# Patient Record
Sex: Male | Born: 1970 | Race: White | Hispanic: No | Marital: Married | Smoking: Former smoker
Health system: Southern US, Community
[De-identification: ages and names within clinical notes are randomized; demographics above are authoritative.]

## PROBLEM LIST (undated history)

## (undated) DIAGNOSIS — S42309A Unspecified fracture of shaft of humerus, unspecified arm, initial encounter for closed fracture: Secondary | ICD-10-CM

## (undated) HISTORY — PX: TONSILLECTOMY: SUR1361

---

## 2016-01-26 ENCOUNTER — Ambulatory Visit
Admission: EM | Admit: 2016-01-26 | Discharge: 2016-01-26 | Disposition: A | Discharge status: Home / Self Care | Payer: Managed Care, Other (non HMO) | Attending: Family Medicine | Admitting: Family Medicine

## 2016-01-26 ENCOUNTER — Ambulatory Visit (INDEPENDENT_AMBULATORY_CARE_PROVIDER_SITE_OTHER): Payer: Managed Care, Other (non HMO)

## 2016-01-26 ENCOUNTER — Encounter: Payer: Self-pay | Admitting: Emergency Medicine

## 2016-01-26 DIAGNOSIS — S61411A Laceration without foreign body of right hand, initial encounter: Secondary | ICD-10-CM

## 2016-01-26 HISTORY — DX: Unspecified fracture of shaft of humerus, unspecified arm, initial encounter for closed fracture: S42.309A

## 2016-01-26 MED ORDER — MUPIROCIN 2 % EX OINT: 1.0000 "application " | TOPICAL_OINTMENT | Freq: Three times a day (TID) | CUTANEOUS | Status: DC

## 2016-01-26 MED ORDER — SULFAMETHOXAZOLE-TRIMETHOPRIM 800-160 MG PO TABS: 1.0000 | ORAL_TABLET | Freq: Two times a day (BID) | ORAL | Status: AC

## 2016-01-26 MED ORDER — LIDOCAINE HCL (PF) 1 % IJ SOLN
10.0000 mL | Freq: Once | INTRAMUSCULAR | Status: AC
  Administered 2016-01-26: 10 mL via INTRADERMAL

## 2016-01-26 NOTE — ED Notes (Signed)
Pt reports cut Right hand on metal roofing. 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 Pt had about an 8 foot fall, landed on Left leg.  Pt denies hitting head or LOC.

## 2016-01-26 NOTE — ED Notes (Signed)
Confirmed in Epic , pt received TDAP 08-26-2015.

## 2016-01-26 NOTE — ED Notes (Signed)
Bill put ace wrap around pt's R hand.

## 2016-01-26 NOTE — Discharge Instructions (Signed)
Laceration Care, Adult °A laceration is a cut that goes through all of the layers of the skin and into the tissue that is right under the skin. Some lacerations heal on their own. Others need to be closed with stitches (sutures), staples, skin adhesive strips, or skin glue. Proper laceration care minimizes the risk of infection and helps the laceration to heal better. °HOW TO CARE FOR YOUR LACERATION °If sutures or staples were used: °· Keep the wound clean and dry. °· If you were given a bandage (dressing), you should change it at least one time per day or as told by your health care provider. You should also change it if it becomes wet or dirty. °· Keep the wound completely dry for the first 24 hours or as told by your health care provider. After that time, you may shower or bathe. However, make sure that the wound is not soaked in water until after the sutures or staples have been removed. °· Clean the wound one time each day or as told by your health care provider: °¨ Wash the wound with soap and water. °¨ Rinse the wound with water to remove all soap. °¨ Pat the wound dry with a clean towel. Do not rub the wound. °· After cleaning the wound, apply a thin layer of antibiotic ointment as told by your health care provider. This will help to prevent infection and keep the dressing from sticking to the wound. °· Have the sutures or staples removed as told by your health care provider. °If skin adhesive strips were used: °· Keep the wound clean and dry. °· If you were given a bandage (dressing), you should change it at least one time per day or as told by your health care provider. You should also change it if it becomes dirty or wet. °· Do not get the skin adhesive strips wet. You may shower or bathe, but be careful to keep the wound dry. °· If the wound gets wet, pat it dry with a clean towel. Do not rub the wound. °· Skin adhesive strips fall off on their own. You may trim the strips as the wound heals. Do not  remove skin adhesive strips that are still stuck to the wound. They will fall off in time. °If skin glue was used: °· Try to keep the wound dry, but you may briefly wet it in the shower or bath. Do not soak the wound in water, such as by swimming. °· After you have showered or bathed, gently pat the wound dry with a clean towel. Do not rub the wound. °· Do not do any activities that will make you sweat heavily until the skin glue has fallen off on its own. °· Do not apply liquid, cream, or ointment medicine to the wound while the skin glue is in place. Using those may loosen the film before the wound has healed. °· If you were given a bandage (dressing), you should change it at least one time per day or as told by your health care provider. You should also change it if it becomes dirty or wet. °· If a dressing is placed over the wound, be careful not to apply tape directly over the skin glue. Doing that may cause the glue to be pulled off before the wound has healed. °· Do not pick at the glue. The skin glue usually remains in place for 5-10 days, then it falls off of the skin. °General Instructions °· Take over-the-counter and prescription   medicines only as told by your health care provider. °· If you were prescribed an antibiotic medicine or ointment, take or apply it as told by your doctor. Do not stop using it even if your condition improves. °· To help prevent scarring, make sure to cover your wound with sunscreen whenever you are outside after stitches are removed, after adhesive strips are removed, or when glue remains in place and the wound is healed. Make sure to wear a sunscreen of at least 30 SPF. °· Do not scratch or pick at the wound. °· Keep all follow-up visits as told by your health care provider. This is important. °· Check your wound every day for signs of infection. Watch for: °· Redness, swelling, or pain. °· Fluid, blood, or pus. °· Raise (elevate) the injured area above the level of your heart  while you are sitting or lying down, if possible. °SEEK MEDICAL CARE IF: °· You received a tetanus shot and you have swelling, severe pain, redness, or bleeding at the injection site. °· You have a fever. °· A wound that was closed breaks open. °· You notice a bad smell coming from your wound or your dressing. °· You notice something coming out of the wound, such as wood or glass. °· Your pain is not controlled with medicine. °· You have increased redness, swelling, or pain at the site of your wound. °· You have fluid, blood, or pus coming from your wound. °· You notice a change in the color of your skin near your wound. °· You need to change the dressing frequently due to fluid, blood, or pus draining from the wound. °· You develop a new rash. °· You develop numbness around the wound. °SEEK IMMEDIATE MEDICAL CARE IF: °· You develop severe swelling around the wound. °· Your pain suddenly increases and is severe. °· You develop painful lumps near the wound or on skin that is anywhere on your body. °· You have a red streak going away from your wound. °· The wound is on your hand or foot and you cannot properly move a finger or toe. °· The wound is on your hand or foot and you notice that your fingers or toes look pale or bluish. °  °This information is not intended to replace advice given to you by your health care provider. Make sure you discuss any questions you have with your health care provider. °  °Document Released: 09/23/2005 Document Revised: 02/07/2015 Document Reviewed: 09/19/2014 °Elsevier Interactive Patient Education ©2016 Else[employer] ° °Stitches, Staples, or Adhesive Wound Closure °Health care providers use stitches (sutures), staples, and certain glue (skin adhesives) to hold skin together while it heals (wound closure). You may need this treatment after you have surgery or if you cut your skin accidentally. These methods help your skin to heal more quickly and make it less likely that you will have  a scar. A wound may take several months to heal completely. °The type of wound you have determines when your wound gets closed. In most cases, the wound is closed as soon as possible (primary skin closure). Sometimes, closure is delayed so the wound can be cleaned and allowed to heal naturally. This reduces the chance of infection. Delayed closure may be needed if your wound: °· Is caused by a bite. °· Happened more than 6 hours ago. °· Involves loss of skin or the tissues under the skin. °· Has dirt or debris in it that cannot be removed. °· Is infected. °WHAT   ARE THE DIFFERENT KINDS OF WOUND CLOSURES? °There are many options for wound closure. The one that your health care provider uses depends on how deep and how large your wound is. °Adhesive Glue °To use this type of glue to close a wound, your health care provider holds the edges of the wound together and paints the glue on the surface of your skin. You may need more than one layer of glue. Then the wound may be covered with a light bandage (dressing). °This type of skin closure may be used for small wounds that are not deep (superficial). Using glue for wound closure is less painful than other methods. It does not require a medicine that numbs the area (local anesthetic). This method also leaves nothing to be removed. Adhesive glue is often used for children and on facial wounds. °Adhesive glue cannot be used for wounds that are deep, uneven, or bleeding. It is not used inside of a wound.  °Adhesive Strips °These strips are made of sticky (adhesive), porous paper. They are applied across your skin edges like a regular adhesive bandage. You leave them on until they fall off. °Adhesive strips may be used to close very superficial wounds. They may also be used along with sutures to improve the closure of your skin edges.  °Sutures °Sutures are the oldest method of wound closure. Sutures can be made from natural substances, such as silk, or from synthetic  materials, such as nylon and steel. They can be made from a material that your body can break down as your wound heals (absorbable), or they can be made from a material that needs to be removed from your skin (nonabsorbable). They come in many different strengths and sizes. °Your health care provider attaches the sutures to a steel needle on one end. Sutures can be passed through your skin, or through the tissues beneath your skin. Then they are tied and cut. Your skin edges may be closed in one continuous stitch or in separate stitches. °Sutures are strong and can be used for all kinds of wounds. Absorbable sutures may be used to close tissues under the skin. The disadvantage of sutures is that they may cause skin reactions that lead to infection. Nonabsorbable sutures need to be removed. °Staples °When surgical staples are used to close a wound, the edges of your skin on both sides of the wound are brought close together. A staple is placed across the wound, and an instrument secures the edges together. Staples are often used to close surgical cuts (incisions). °Staples are faster to use than sutures, and they cause less skin reaction. Staples need to be removed using a tool that bends the staples away from your skin. °HOW DO I CARE FOR MY WOUND CLOSURE? °· Take medicines only as directed by your health care provider. °· If you were prescribed an antibiotic medicine for your wound, finish it all even if you start to feel better. °· Use ointments or creams only as directed by your health care provider. °· Wash your hands with soap and water before and after touching your wound. °· Do not soak your wound in water. Do not take baths, swim, or use a hot tub until your health care provider approves. °· Ask your health care provider when you can start showering. Cover your wound if directed by your health care provider. °· Do not take out your own sutures or staples. °· Do not pick at your wound. Picking can cause an  infection. °·   Keep all follow-up visits as directed by your health care provider. This is important. HOW LONG WILL I HAVE MY WOUND CLOSURE?  Leave adhesive glue on your skin until the glue peels away.  Leave adhesive strips on your skin until the strips fall off.  Absorbable sutures will dissolve within several days.  Nonabsorbable sutures and staples must be removed. The location of the wound will determine how long they stay in. This can range from several days to a couple of weeks. WHEN SHOULD I SEEK HELP FOR MY WOUND CLOSURE? Contact your health care provider if:  You have a fever.  You have chills.  You have drainage, redness, swelling, or pain at your wound.  There is a bad smell coming from your wound.  The skin edges of your wound start to separate after your sutures have been removed.  Your wound becomes thick, raised, and darker in color after your sutures come out (scarring).   This information is not intended to replace advice given to you by your health care provider. Make sure you discuss any questions you have with your health care provider.   Document Released: 06/18/2001 Document Revised: 10/14/2014 Document Reviewed: 03/02/2014 Elsevier Interactive Patient Education 2016 Else[employer] Inc.  Wound Check If you have a wound, it may take some time to heal. Eventually, a scar will form. The scar will also fade with time. It is important to take care of your wound while it is healing. This helps to protect your wound from infection.  HOW SHOULD I TAKE CARE OF MY WOUND AT HOME?  Some wounds are allowed to close on their own or are repaired at a later date. There are many different ways to close and cover a wound, including stitches (sutures), skin glue, and adhesive strips. Follow your health care provider's instructions about:  Wound care.  Bandage (dressing) changes and removal.  Wound closure removal.  Take medicines only as directed by your health care  provider.  Keep all follow-up visits as directed by your health care provider. This is important.  Do not take baths, swim, or use a hot tub until your health care provider approves. You may shower as directed by your health care provider.  Keep your wound clean and dry. WHAT AFFECTS SCAR FORMATION? Scars affect each person differently. How your body scars depends on:  The location and size of your wound.  Traits that you inherited from your parents (genetic predisposition).  How you take care of your wound. Irritation and inflammation increase the amount of scar formation.  Sun exposure. This can darken a scar. WHEN SHOULD I CALL OR SEE MY HEALTH CARE PROVIDER? Call or see your health care provider if:  You have redness, swelling, or pain at your wound site.  You have fluid, blood, or pus coming from your wound.  You have muscle aches, chills, or a general ill feeling.  You notice a bad smell coming from the wound.  Your wound separates after the sutures, staples, or skin adhesive strips have been removed.  You have persistent nausea or vomiting.  You have a fever.  You are dizzy. WHEN SHOULD I CALL 911 OR GO TO THE EMERGENCY ROOM? Call 911 or go to the emergency room if:  You faint.  You have difficulty breathing.   This information is not intended to replace advice given to you by your health care provider. Make sure you discuss any questions you have with your health care provider.   Document Released:  06/29/2004 Document Revised: 10/14/2014 Document Reviewed: 07/05/2014 Elsevier Interactive Patient Education [employer].

## 2016-01-26 NOTE — ED Notes (Signed)
Pt reports he had a tetanus shot last year when he cut his Left arm. [facility]

## 2016-01-26 NOTE — ED Provider Notes (Signed)
CSN: [id number]     Arrival date & time 01/26/16  1824 History   First MD Initiated Contact with Patient 01/26/16 1915     Chief Complaint  Patient presents with  . 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 He fell to the ground his hand hit his scattered leaves and debris states categorically that there was no chickened feces or droppings. He is current on his DTaP which she received at [facility] about 7 months ago. He is allergic to azithromycin   Past Medical History  Diagnosis Date  . Arm fracture     as a child   Past Surgical History  Procedure Laterality Date  . Tonsillectomy     History reviewed. No pertinent family history. Social History  Substance Use Topics  . Smoking status: Former Games developer  . Smokeless tobacco: None  . Alcohol Use: Yes    Review of Systems  Constitutional: Positive for activity change. Negative for fever, chills and fatigue.  Skin: Positive for wound.  All other systems reviewed and are negative.   Allergies  Azithromycin  Home Medications   Prior to Admission medications   Medication Sig Start Date End Date Taking? Authorizing Provider  mupirocin ointment (BACTROBAN) 2 % Apply 1 application topically 3 (three) times daily. 01/26/16   Lutricia Feil, PA-C  sulfamethoxazole-trimethoprim (BACTRIM DS,SEPTRA DS) 800-160 MG tablet Take 1 tablet by mouth 2 (two) times daily. 01/26/16   Lutricia Feil, PA-C   Meds Ordered and Administered this Visit   Medications  lidocaine (PF) (XYLOCAINE) 1 % injection 10 mL (10 mLs Intradermal Given 01/26/16 1930)    BP 140/96 mmHg  Pulse 106  Temp(Src) 97.6 F (36.4 C) (Oral)  Resp 18  Ht 6' (1.829 m)  Wt 240 lb (108.863 kg)  BMI 32.54 kg/m2 No data found.   Physical Exam  Constitutional: He is oriented to  person, place, and time. He appears well-developed and well-nourished. No distress.  HENT:  Head: Normocephalic and atraumatic.  Eyes: Conjunctivae are normal. Pupils are equal, round, and reactive to light.  Neck: Normal range of motion. Neck supple.  Musculoskeletal:  Exam nation of the right dominant arm shows an ulnar-based type flap with the pedicle distally. It measures 2 cm the ulnar leg 3 cm on the radial leg and a half centimeters at the pedicle base. The flap was contaminated grossly with debris adherent to the wound itself. This is on both the flap and the palmar side. Patient had good sensation of the middle ring and little fingers prior to anesthetizing and good tendon movement.  Lymphadenopathy:    He has no cervical adenopathy.  Neurological: He is alert and oriented to person, place, and time.  Skin: Skin is warm and dry. He is not diaphoretic. There is erythema.  Psychiatric: He has a normal mood and affect. His behavior is normal. Judgment and thought content normal.  Nursing note and vitals reviewed.   ED Course  .Marland KitchenLaceration Repair Date/Time: 01/26/2016 8:40 PM Performed by: Lutricia Feil Authorized by: Hassan Rowan Consent: Verbal consent obtained. Risks and benefits: risks, benefits and alternatives were discussed Consent given by: patient Patient understanding: patient states understanding of the procedure being performed Patient identity confirmed: verbally with patient, arm band and hospital-assigned identification number Body area: upper extremity Location details: right hand Laceration length: 7 cm Contamination: The wound is  contaminated. Foreign bodies: wood Tendon involvement: none Nerve involvement: none Vascular damage: no Anesthesia: local infiltration Local anesthetic: lidocaine 1% without epinephrine Anesthetic total: 9 ml Patient sedated: no Preparation: Patient was prepped and draped in the usual sterile fashion. Irrigation solution:  saline Irrigation method: jet lavage Amount of cleaning: extensive Debridement: none Degree of undermining: none Skin closure: 3-0 nylon and Ethilon Number of sutures: 10 Technique: simple Approximation: close Approximation difficulty: simple Dressing: 4x4 sterile gauze, antibiotic ointment, pressure dressing, gauze roll and tube gauze Patient tolerance: Patient tolerated the procedure well with no immediate complications Comments: Advised the patient and his wife that the flap may be not be viable but that tacking it in place provided the best biodressing allow for granulation tissue to form. He will come back in 2 days for wound check. Signs and symptoms of infection were outlined to the patient and his wife. Start him on Septra and Bactroban washes 3 times a day. Notice any changes in the wound or the patient agrees begins to run a fever etc. they will return to our clinic immediately or go to emergency department.   (including critical care time)  Labs Review Labs Reviewed - No data to display  Imaging Review Dg Hand Complete Right  01/26/2016  CLINICAL DATA:  Laceration to palm of hand on metal sheet today. EXAM: RIGHT HAND - COMPLETE 3+ VIEW COMPARISON:  None. FINDINGS: There is no evidence of acute fracture or dislocation. Old fracture deformity of the fifth metacarpal noted. Palmar soft tissue laceration noted. No radiopaque foreign body identified. IMPRESSION: Palmar soft tissue laceration noted. No evidence of radiopaque foreign body or acute fracture . Electronically Signed   By: Myles RosenthalJohn  Stahl M.D.   On: 01/26/2016 19:03     Visual Acuity Review  Right Eye Distance:   Left Eye Distance:   Bilateral Distance:    Right Eye Near:   Left Eye Near:    Bilateral Near:         MDM   1. Laceration of right palm, initial encounter    Discharge Medication List as of 01/26/2016  8:13 PM    START taking these medications   Details  mupirocin ointment (BACTROBAN) 2 % Apply 1  application topically 3 (three) times daily., Starting 01/26/2016, Until Discontinued, Print    sulfamethoxazole-trimethoprim (BACTRIM DS,SEPTRA DS) 800-160 MG tablet Take 1 tablet by mouth 2 (two) times daily., Starting 01/26/2016, Until Discontinued, Print      Plan: 1. Test/x-ray results and diagnosis reviewed with patient 2. rx as per orders; risks, benefits, potential side effects reviewed with patient 3. Recommend supportive treatment with Elevation as necessary. Keep dry for 24 hours and then start a washing program for 3 times a day applying Bactroban following drying thoroughly. Follow-up in 2 days for wound check and will plan on leaving the sutures in place for 10 days. I have asked him not to attend to the chickens unless he wears a latex glove and is very careful not to agree contaminated wound. 4. F/u prn if symptoms worsen or don't improve      Lutricia FeilWilliam P Roemer, PA-C 01/26/16 2055

## 2016-01-28 ENCOUNTER — Ambulatory Visit
Admission: EM | Admit: 2016-01-28 | Discharge: 2016-01-28 | Disposition: A | Discharge status: Home / Self Care | Payer: Managed Care, Other (non HMO) | Attending: Family Medicine | Admitting: Family Medicine

## 2016-01-28 DIAGNOSIS — S61412S Laceration without foreign body of left hand, sequela: Secondary | ICD-10-CM

## 2016-01-28 DIAGNOSIS — S66922S Laceration of unspecified muscle, fascia and tendon at wrist and hand level, left hand, sequela: Principal | ICD-10-CM

## 2016-01-28 NOTE — ED Notes (Addendum)
Follow up for Rt. Arm was seen on 01/26/2016.

## 2016-01-28 NOTE — ED Provider Notes (Signed)
CSN: [id number]     Arrival date & time 01/28/16  1108 History   First MD Initiated Contact with Patient 01/28/16 1256     Nurses notes were reviewed. Chief Complaint  Patient presents with  . Arm Injury   Patient's here because of recheck of his wound he was seen by Mr. Phillis KnackRoemer on Thursday and lacerations repaired. Significant amount of debris was found in the wound from which is well.   (Consider location/radiation/quality/duration/timing/severity/associated sxs/prior Treatment) HPI  Past Medical History  Diagnosis Date  . Arm fracture     as a child   Past Surgical History  Procedure Laterality Date  . Tonsillectomy     History reviewed. No pertinent family history. Social History  Substance Use Topics  . Smoking status: Former Games developermoker  . Smokeless tobacco: None  . Alcohol Use: Yes    Review of Systems  Allergies  Azithromycin  Home Medications   Prior to Admission medications   Medication Sig Start Date End Date Taking? Authorizing Provider  mupirocin ointment (BACTROBAN) 2 % Apply 1 application topically 3 (three) times daily. 01/26/16  Yes Lutricia FeilWilliam P Roemer, PA-C  sulfamethoxazole-trimethoprim (BACTRIM DS,SEPTRA DS) 800-160 MG tablet Take 1 tablet by mouth 2 (two) times daily. 01/26/16  Yes Lutricia FeilWilliam P Roemer, PA-C   Meds Ordered and Administered this Visit  Medications - No data to display  BP 133/90 mmHg  Pulse 82  Temp(Src) 98.1 F (36.7 C) (Oral)  Resp 16  SpO2 100% No data found.   Physical Exam  Constitutional: He appears well-developed and well-nourished.  HENT:  Head: Normocephalic and atraumatic.  Eyes: Pupils are equal, round, and reactive to light.  Musculoskeletal: He exhibits edema and tenderness.       Arms:      Left hand: He exhibits tenderness and laceration.       Hands: The palmar surface of left hand the wound appears to be healing there is some cyanosis at the border but overall no signs of active infection no stiff can drainage  coming from the wound either.  Neurological: He is alert.  Skin: There is erythema.  Vitals reviewed.   ED Course  Procedures (including critical care time)  Labs Review Labs Reviewed - No data to display  Imaging Review Dg Hand Complete Right  01/26/2016  CLINICAL DATA:  Laceration to palm of hand on metal sheet today. EXAM: RIGHT HAND - COMPLETE 3+ VIEW COMPARISON:  None. FINDINGS: There is no evidence of acute fracture or dislocation. Old fracture deformity of the fifth metacarpal noted. Palmar soft tissue laceration noted. No radiopaque foreign body identified. IMPRESSION: Palmar soft tissue laceration noted. No evidence of radiopaque foreign body or acute fracture . Electronically Signed   By: Myles RosenthalJohn  Stahl M.D.   On: 01/26/2016 19:03     Visual Acuity Review  Right Eye Distance:   Left Eye Distance:   Bilateral Distance:    Right Eye Near:   Left Eye Near:    Bilateral Near:         MDM   1. Hand laceration involving tendon, left, sequela    Patient wound is healing we'll continue to use back pain ointment Septra DS and keep the area wrapped return Thursday 4 days for recheck. Note: This dictation was prepared with Dragon dictation along with smaller phrase technology. Any transcriptional errors that result from this process are unintentional.     Chad RowanEugene Karthik Whittinghill, MD 01/28/16 1326

## 2016-01-28 NOTE — Discharge Instructions (Signed)
Wound Care °Taking care of your wound properly can help to prevent pain and infection. It can also help your wound to heal more quickly.  °HOW TO CARE FOR YOUR WOUND  °· Take or apply over-the-counter and prescription medicines only as told by your health care provider. °· If you were prescribed antibiotic medicine, take or apply it as told by your health care provider. Do not stop using the antibiotic even if your condition improves. °· Clean the wound each day or as told by your health care provider. °¨ Wash the wound with mild soap and water. °¨ Rinse the wound with water to remove all soap. °¨ Pat the wound dry with a clean towel. Do not rub it. °· There are many different ways to close and cover a wound. For example, a wound can be covered with stitches (sutures), skin glue, or adhesive strips. Follow instructions from your health care provider about: °¨ How to take care of your wound. °¨ When and how you should change your bandage (dressing). °¨ When you should remove your dressing. °¨ Removing whatever was used to close your wound. °· Check your wound every day for signs of infection. Watch for: °¨ Redness, swelling, or pain. °¨ Fluid, blood, or pus. °· Keep the dressing dry until your health care provider says it can be removed. Do not take baths, swim, use a hot tub, or do anything that would put your wound underwater until your health care provider approves. °· Raise (elevate) the injured area above the level of your heart while you are sitting or lying down. °· Do not scratch or pick at the wound. °· Keep all follow-up visits as told by your health care provider. This is important. °SEEK MEDICAL CARE IF: °· You received a tetanus shot and you have swelling, severe pain, redness, or bleeding at the injection site. °· You have a fever. °· Your pain is not controlled with medicine. °· You have increased redness, swelling, or pain at the site of your wound. °· You have fluid, blood, or pus coming from your  wound. °· You notice a bad smell coming from your wound or your dressing. °SEEK IMMEDIATE MEDICAL CARE IF: °· You have a red streak going away from your wound. °  °This information is not intended to replace advice given to you by your health care provider. Make sure you discuss any questions you have with your health care provider. °  °Document Released: 07/02/2008 Document Revised: 02/07/2015 Document Reviewed: 09/19/2014 °Elsevier Interactive Patient Education ©2016 [employer] ° °

## 2016-02-01 ENCOUNTER — Encounter: Payer: Self-pay | Admitting: Emergency Medicine

## 2016-02-01 ENCOUNTER — Ambulatory Visit
Admission: EM | Admit: 2016-02-01 | Discharge: 2016-02-01 | Disposition: A | Discharge status: Home / Self Care | Payer: Managed Care, Other (non HMO) | Attending: Family Medicine | Admitting: Family Medicine

## 2016-02-01 DIAGNOSIS — S61411D Laceration without foreign body of right hand, subsequent encounter: Secondary | ICD-10-CM

## 2016-02-01 NOTE — ED Provider Notes (Signed)
CSN: [id number]     Arrival date & time 02/01/16  [id number] History   First MD Initiated Contact with Patient 02/01/16 [id number]     Chief Complaint  Patient presents with  . Wound Check   (Consider location/radiation/quality/duration/timing/severity/associated sxs/prior Treatment) HPI  Patient returns today for another wound check at the request of Dr. Thurmond ButtsWade. This patient had a highly contaminated wound when he fell off the roof of a chicken [employer] lacerating his right dominant hand on the ulnar palm. Careful washing and debridement of the wound to remove all of the visible foreign material was performed at the time of the laceration repair. She has been very diligent with his washing program application of Bactroban. He's had no fever no significant discharge no ascending lymphangitis and no increasing pain.    Past Medical History  Diagnosis Date  . Arm fracture     as a child   Past Surgical History  Procedure Laterality Date  . Tonsillectomy     History reviewed. No pertinent family history. Social History  Substance Use Topics  . Smoking status: Former Games developermoker  . Smokeless tobacco: None  . Alcohol Use: Yes    Review of Systems  Constitutional: Positive for activity change. Negative for fever, chills and fatigue.  Skin: Positive for wound.  All other systems reviewed and are negative.   Allergies  Azithromycin  Home Medications   Prior to Admission medications   Medication Sig Start Date End Date Taking? Authorizing Provider  mupirocin ointment (BACTROBAN) 2 % Apply 1 application topically 3 (three) times daily. 01/26/16   Lutricia FeilWilliam P Roemer, PA-C  sulfamethoxazole-trimethoprim (BACTRIM DS,SEPTRA DS) 800-160 MG tablet Take 1 tablet by mouth 2 (two) times daily. 01/26/16   Lutricia FeilWilliam P Roemer, PA-C   Meds Ordered and Administered this Visit  Medications - No data to display  BP 142/94 mmHg  Pulse 86  Temp(Src) 97.5 F (36.4 C) (Tympanic)  Resp 16  Ht 6' (1.829 m)  Wt 240 lb  (108.863 kg)  BMI 32.54 kg/m2  SpO2 99% No data found.   Physical Exam  Constitutional: He is oriented to person, place, and time. He appears well-developed and well-nourished. No distress.  HENT:  Head: Normocephalic and atraumatic.  Eyes: Conjunctivae are normal. Pupils are equal, round, and reactive to light.  Neck: Normal range of motion. Neck supple.  Musculoskeletal: Normal range of motion. He exhibits no edema or tenderness.  Neurological: He is alert and oriented to person, place, and time.  Skin: Skin is warm and dry. He is not diaphoretic.  Examination of the right hand palm shows the wound to have some ecchymosis of the distal portion of the flap but the pedicle appears viable. There is no purulence that can be expressed from the wound. There is no drainage. Lectured tendons are intact and strong. Sensation is intact throughout.  Psychiatric: He has a normal mood and affect. His behavior is normal. Judgment and thought content normal.  Nursing note and vitals reviewed.   ED Course  Procedures (including critical care time)  Labs Review Labs Reviewed - No data to display  Imaging Review No results found.   Visual Acuity Review  Right Eye Distance:   Left Eye Distance:   Bilateral Distance:    Right Eye Near:   Left Eye Near:    Bilateral Near:         MDM   1. Hand laceration, right, subsequent encounter    The patient will was told to continue  with his washing program as it is working well. I wanted to finish out his oral antibiotics. We will see him in follow-up on Monday for suture removal. Once again I've warned him that portion of the flap may slough off but hopefully granulation will have started on the depth of the wound. He most assuredly will have some scarring in that area which is unavoidable.    Lutricia Feil, PA-C 02/01/16 [phone]

## 2016-02-01 NOTE — ED Notes (Signed)
Patient here for wound check to his right hand.

## 2016-02-05 ENCOUNTER — Encounter: Payer: Self-pay | Admitting: *Deleted

## 2016-02-05 ENCOUNTER — Ambulatory Visit
Admission: EM | Admit: 2016-02-05 | Discharge: 2016-02-05 | Disposition: A | Discharge status: Home / Self Care | Payer: Managed Care, Other (non HMO) | Attending: Emergency Medicine | Admitting: Emergency Medicine

## 2016-02-05 DIAGNOSIS — S61411D Laceration without foreign body of right hand, subsequent encounter: Secondary | ICD-10-CM

## 2016-02-05 DIAGNOSIS — Z4802 Encounter for removal of sutures: Secondary | ICD-10-CM

## 2016-02-05 MED ORDER — MUPIROCIN 2 % EX OINT: 1.0000 "application " | TOPICAL_OINTMENT | Freq: Three times a day (TID) | CUTANEOUS | Status: AC

## 2016-02-05 NOTE — ED Provider Notes (Signed)
CSN: [id number]     Arrival date & time 02/05/16  0805 History   None    Chief Complaint  Patient presents with  . Suture / Staple Removal   (Consider location/radiation/quality/duration/timing/severity/associated sxs/prior Treatment) HPI  Patient returns today for possible suture removal from a laceration on his right palm. He is now 10 days following the injury. Is no evidence of infection.     Past Medical History  Diagnosis Date  . Arm fracture     as a child   Past Surgical History  Procedure Laterality Date  . Tonsillectomy     History reviewed. No pertinent family history. Social History  Substance Use Topics  . Smoking status: Former Games developermoker  . Smokeless tobacco: None  . Alcohol Use: Yes    Review of Systems  Constitutional: Positive for activity change. Negative for fever, chills and fatigue.  Skin: Positive for wound.    Allergies  Azithromycin  Home Medications   Prior to Admission medications   Medication Sig Start Date End Date Taking? Authorizing Provider  sulfamethoxazole-trimethoprim (BACTRIM DS,SEPTRA DS) 800-160 MG tablet Take 1 tablet by mouth 2 (two) times daily. 01/26/16  Yes Lutricia FeilWilliam P Adaiah Morken, PA-C  mupirocin ointment (BACTROBAN) 2 % Apply 1 application topically 3 (three) times daily. 02/05/16   Lutricia FeilWilliam P Ardie Dragoo, PA-C   Meds Ordered and Administered this Visit  Medications - No data to display  BP 139/89 mmHg  Pulse 92  Temp(Src) 98.5 F (36.9 C) (Oral)  Resp 16  Ht 6' (1.829 m)  Wt 240 lb (108.863 kg)  BMI 32.54 kg/m2  SpO2 99% No data found.   Physical Exam  Constitutional: He is oriented to person, place, and time. He appears well-developed and well-nourished. No distress.  HENT:  Head: Normocephalic and atraumatic.  Eyes: Conjunctivae are normal. Pupils are equal, round, and reactive to light.  Neck: Normal range of motion. Neck supple.  Musculoskeletal: Normal range of motion. He exhibits no edema or tenderness.  Neurological:  He is alert and oriented to person, place, and time.  Skin: Skin is warm and dry. He is not diaphoretic.  Examination of the right ulnar proximal palm shows good healing of the wound. Sutures are remaining intact. The skin edges are slightly separated but apparent that the pedicle may have some viability. Every other suture was removed to allow more healing yet maintaining the bio dressing over the wound to allow longer time for granulation. We will allow the other remaining sutures to stay in place for another 4-5 days before final removal. We'll have the patient to continue with his 3 times a day washings and application of Bactroban.  Psychiatric: He has a normal mood and affect. His behavior is normal. Judgment and thought content normal.  Nursing note and vitals reviewed.   ED Course  Procedures (including critical care time)  Labs Review Labs Reviewed - No data to display  Imaging Review No results found.   Visual Acuity Review  Right Eye Distance:   Left Eye Distance:   Bilateral Distance:    Right Eye Near:   Left Eye Near:    Bilateral Near:         MDM   1. Laceration of right hand with complication, subsequent encounter    Every other suture was removed and the patient will return in 4-5 days for final removal of all sutures. It is hopeful that the flap will survive and if not completely granulation tissue will feel the wound for  good coverage. I will have him continue with the 3 times a day washings application of Bactroban until that time. Refer to physical exam notes  above for further details    Lutricia Feil, PA-C 02/05/16 1610

## 2016-02-05 NOTE — ED Notes (Signed)
Every other suture removed per Reather ConverseW. Roemer.

## 2016-02-05 NOTE — ED Notes (Signed)
Here for suture removal

## 2016-02-09 ENCOUNTER — Ambulatory Visit
Admission: EM | Admit: 2016-02-09 | Discharge: 2016-02-09 | Disposition: A | Discharge status: Home / Self Care | Payer: Managed Care, Other (non HMO)

## 2016-02-09 ENCOUNTER — Encounter: Payer: Self-pay | Admitting: *Deleted

## 2016-02-09 DIAGNOSIS — S61422D Laceration with foreign body of left hand, subsequent encounter: Secondary | ICD-10-CM

## 2016-02-09 NOTE — ED Notes (Signed)
Here for suture removal

## 2016-02-09 NOTE — ED Provider Notes (Signed)
CSN: [id number]     Arrival date & time 02/09/16  0803 History   None    Chief Complaint  Patient presents with  . Suture / Staple Removal   (Consider location/radiation/quality/duration/timing/severity/associated sxs/prior Treatment) HPI   Returns today for final suture removal. Continues to be very compliant with keeping the wound clean and moist with the Bactroban. 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 He has not had any trouble at all with the wound itself. There is some question as to the viability of the flap is still is looking better as time goes on.        Past Medical History  Diagnosis Date  . Arm fracture     as a child   Past Surgical History  Procedure Laterality Date  . Tonsillectomy     History reviewed. No pertinent family history. Social History  Substance Use Topics  . Smoking status: Former Games developermoker  . Smokeless tobacco: None  . Alcohol Use: Yes    Review of Systems  Constitutional: Negative for fever, chills, activity change and fatigue.  All other systems reviewed and are negative.   Allergies  Azithromycin  Home Medications   Prior to Admission medications   Medication Sig Start Date End Date Taking? Authorizing Provider  mupirocin ointment (BACTROBAN) 2 % Apply 1 application topically 3 (three) times daily. 02/05/16   Lutricia FeilWilliam P Roemer, PA-C  sulfamethoxazole-trimethoprim (BACTRIM DS,SEPTRA DS) 800-160 MG tablet Take 1 tablet by mouth 2 (two) times daily. 01/26/16   Lutricia FeilWilliam P Roemer, PA-C   Meds Ordered and Administered this Visit  Medications - No data to display  There were no vitals taken for this visit. No data found.   Physical Exam  Constitutional: He is oriented to person, place, and time. He appears well-developed and well-nourished. No distress.  HENT:  Head: Normocephalic and atraumatic.  Eyes: Conjunctivae are normal. Pupils are equal, round, and reactive to light.  Neck: Normal range of  motion. Neck supple.  Musculoskeletal: Normal range of motion. He exhibits no edema or tenderness.  Neurological: He is alert and oriented to person, place, and time.  Skin: Skin is warm and dry. He is not diaphoretic.  Examination of the wound over the hypothenar eminence of the left hand shows the  skin edges to be adherent. It is clean and dry. Central portion of the flap is ecchymotic but less than his last visit. Hopefully there is a good granulation tissue beneath it if it does slough off. The patient was advised of the possibility.  Psychiatric: He has a normal mood and affect. His behavior is normal. Judgment and thought content normal.  Nursing note and vitals reviewed.   ED Course  Procedures (including critical care time)  Labs Review Labs Reviewed - No data to display  Imaging Review No results found.   Visual Acuity Review  Right Eye Distance:   Left Eye Distance:   Bilateral Distance:    Right Eye Near:   Left Eye Near:    Bilateral Near:         MDM   1. Laceration of left hand with foreign body, subsequent encounter      I told patient that he may expect to have some sloughing of the flap but hopefully the granulation tissue filling beneath. I will not need see him back unless he has problems. He states that he will continue to wash and use Bactroban for the next  week. Also asked him if he is in the area that he should drop by so we take a look and see the scarring that may have occurred from the repair.  Lutricia Feil, PA-C 02/09/16 [phone]

## 2017-05-31 IMAGING — CR DG HAND COMPLETE 3+V*R*
3 series · 3 of 3 positions shown · non-contrast
Comparison: None.

CLINICAL DATA: Laceration to palm of hand on metal sheet today.

EXAM:
RIGHT HAND - COMPLETE 3+ VIEW

[hand ap]
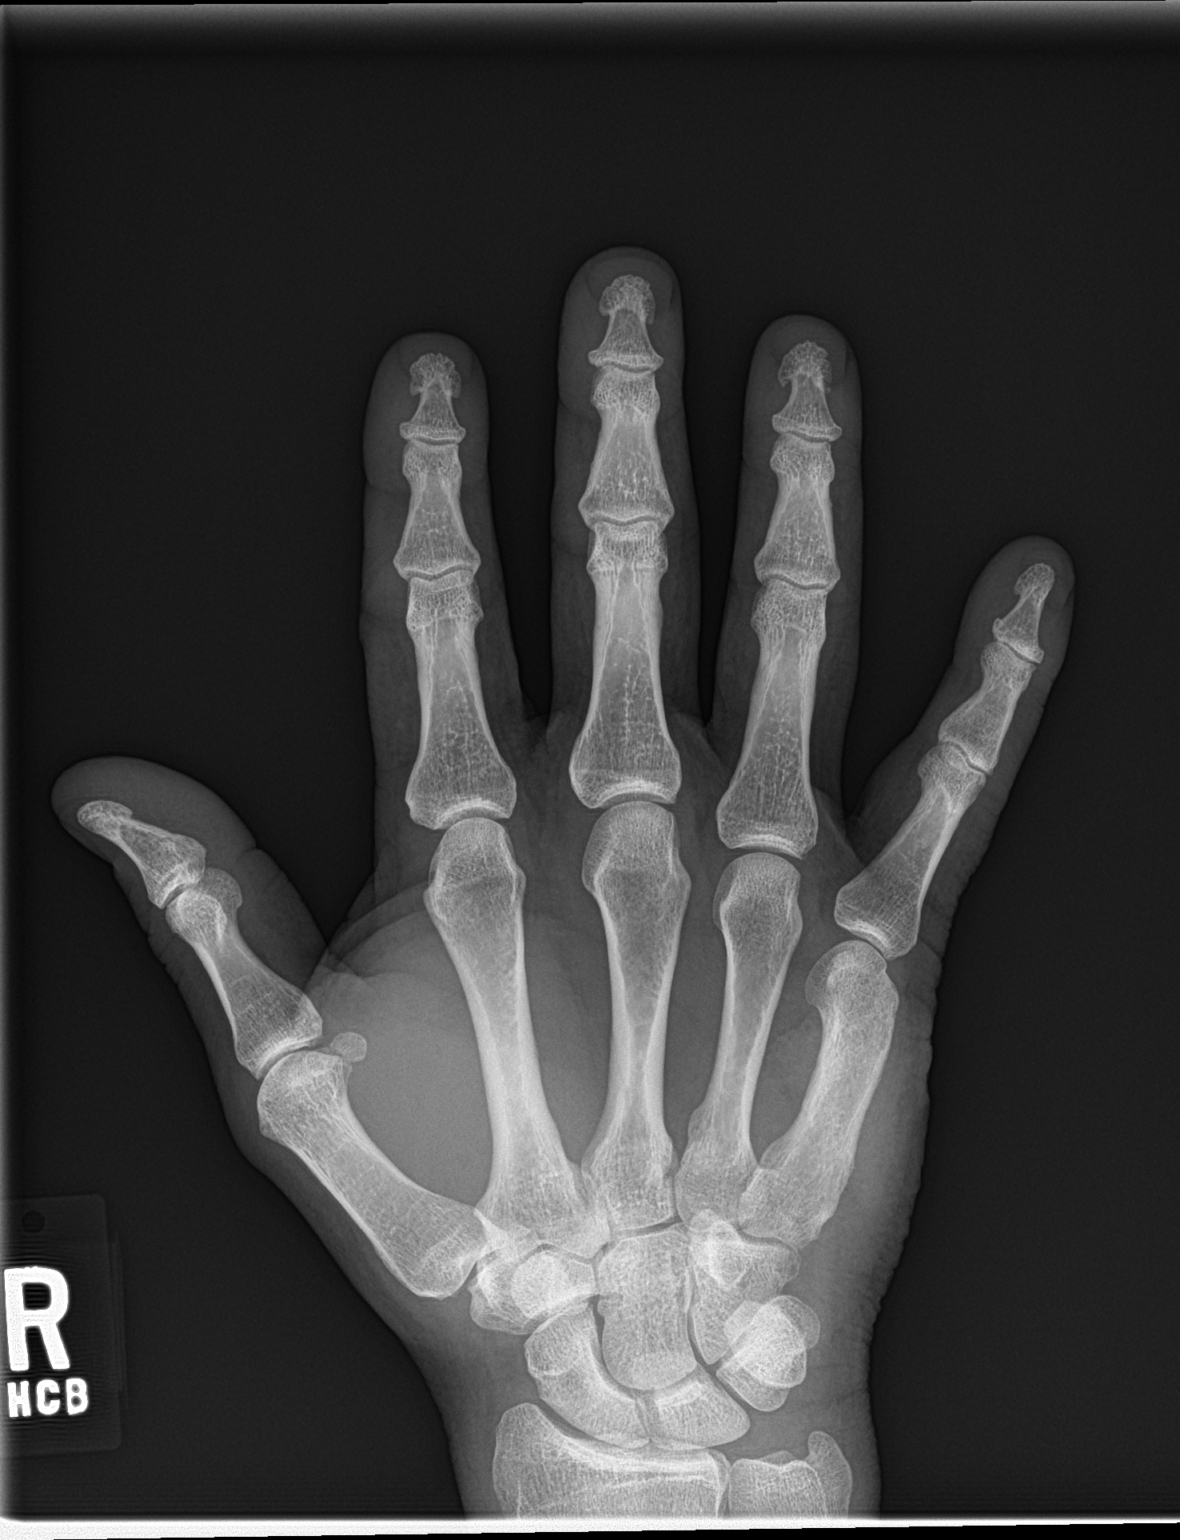

[hand obl]
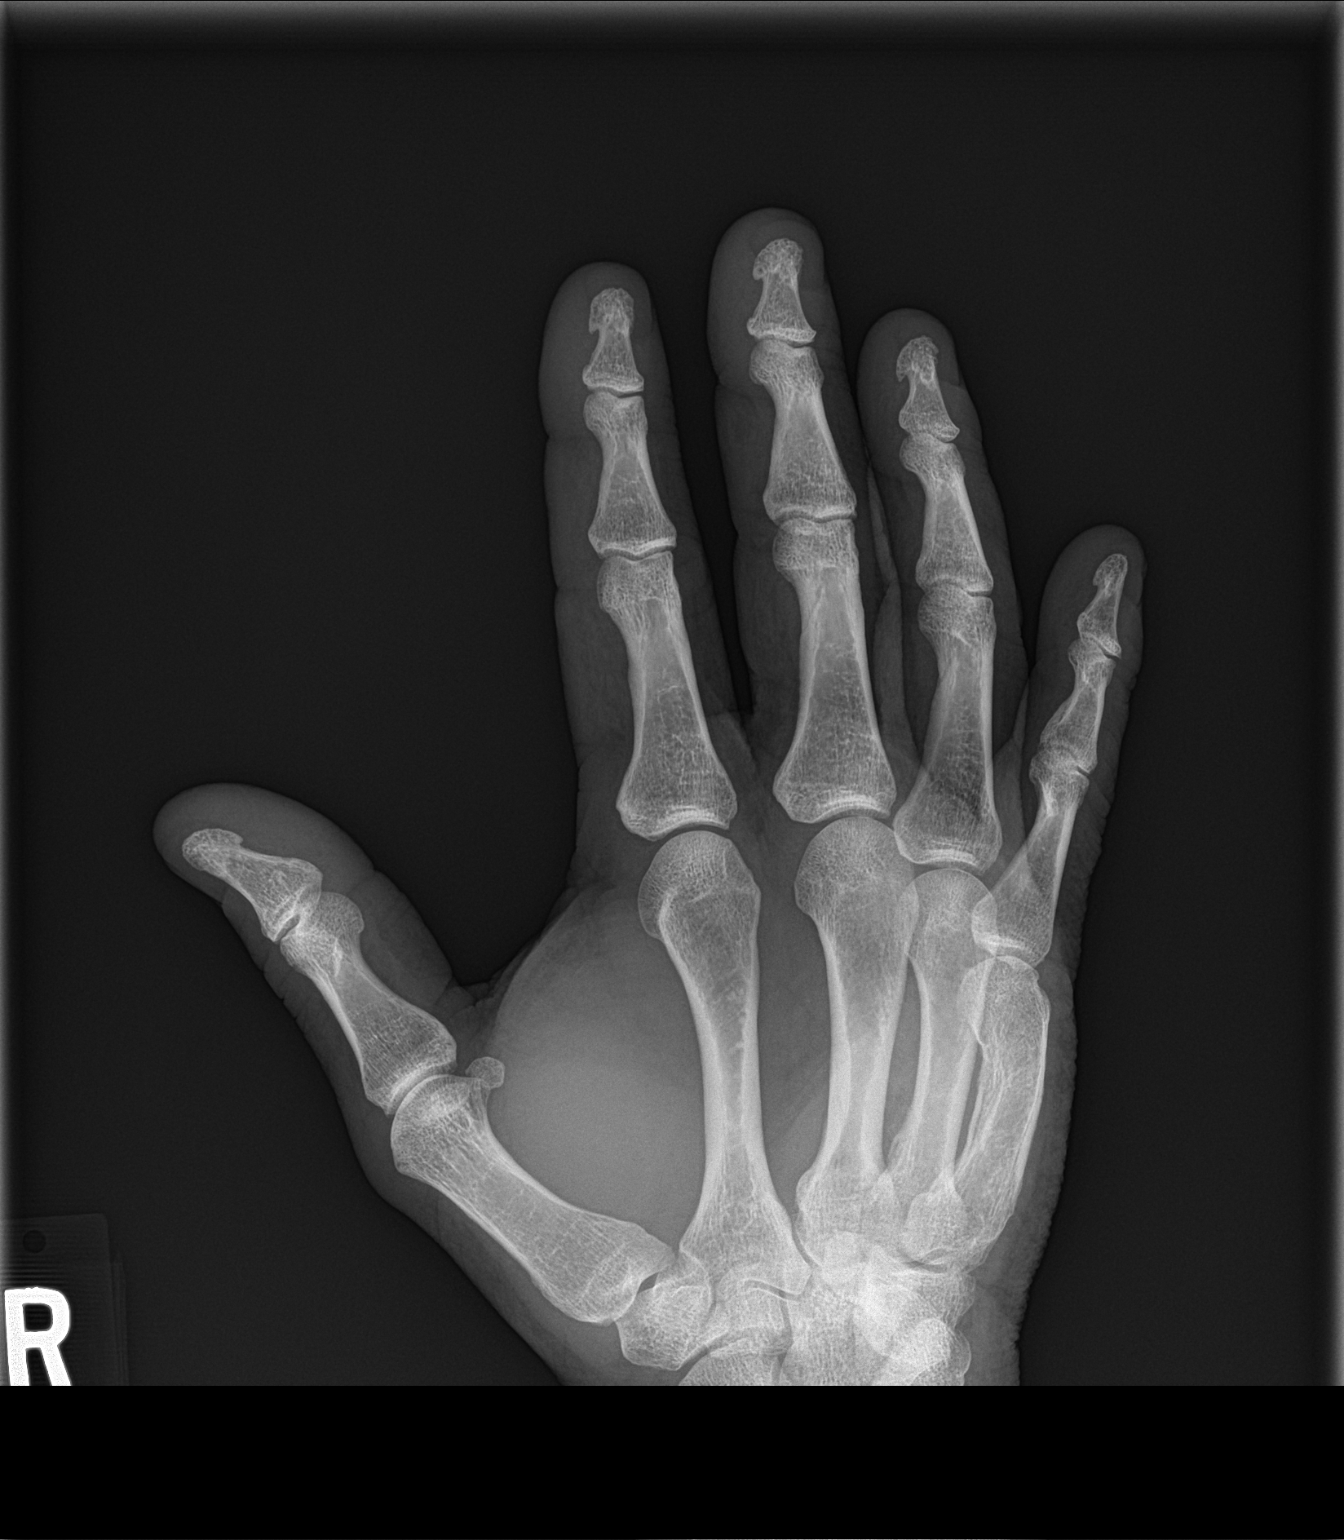

[hand lat]
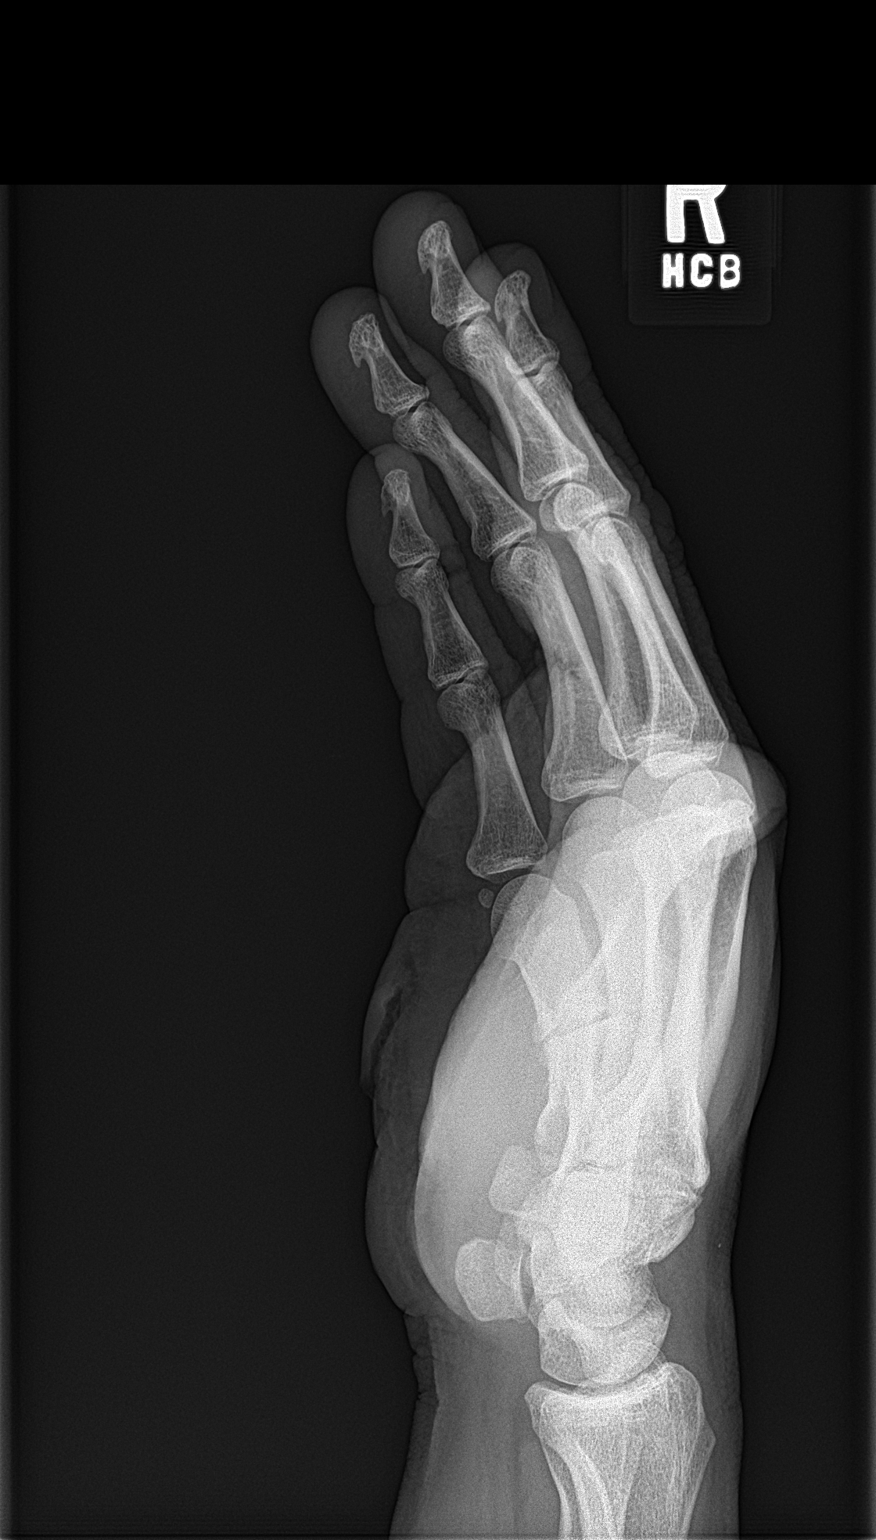

[3 of 3 positions shown; findings below may reference images not displayed]

FINDINGS: There is no evidence of acute fracture or dislocation. Old fracture
deformity of the fifth metacarpal noted. Palmar soft tissue
laceration noted. No radiopaque foreign body identified.
IMPRESSION: Palmar soft tissue laceration noted. No evidence of radiopaque
foreign body or acute fracture .
